# Patient Record
Sex: Male | Born: 1985 | Race: Black or African American | Hispanic: No | Marital: Single | Smoking: Current some day smoker
Health system: Southern US, Community
[De-identification: ages and names within clinical notes are randomized; demographics above are authoritative.]

## PROBLEM LIST (undated history)

## (undated) DIAGNOSIS — Z9889 Other specified postprocedural states: Secondary | ICD-10-CM

## (undated) DIAGNOSIS — Z8719 Personal history of other diseases of the digestive system: Secondary | ICD-10-CM

---

## 2008-07-11 ENCOUNTER — Emergency Department (HOSPITAL_COMMUNITY): Admission: EM | Admit: 2008-07-11 | Discharge: 2008-07-11 | Payer: Self-pay | Admitting: Emergency Medicine

## 2018-06-24 ENCOUNTER — Emergency Department (HOSPITAL_COMMUNITY)
Admission: EM | Admit: 2018-06-24 | Discharge: 2018-06-24 | Disposition: A | Discharge status: Home / Self Care | Payer: Self-pay | Attending: Emergency Medicine | Admitting: Emergency Medicine

## 2018-06-24 ENCOUNTER — Other Ambulatory Visit: Payer: Self-pay

## 2018-06-24 ENCOUNTER — Encounter (HOSPITAL_COMMUNITY): Payer: Self-pay | Admitting: Emergency Medicine

## 2018-06-24 DIAGNOSIS — F17200 Nicotine dependence, unspecified, uncomplicated: Secondary | ICD-10-CM | POA: Insufficient documentation

## 2018-06-24 DIAGNOSIS — Z029 Encounter for administrative examinations, unspecified: Secondary | ICD-10-CM | POA: Insufficient documentation

## 2018-06-24 DIAGNOSIS — Z7189 Other specified counseling: Secondary | ICD-10-CM

## 2018-06-24 HISTORY — DX: Other specified postprocedural states: Z98.890

## 2018-06-24 HISTORY — DX: Personal history of other diseases of the digestive system: Z87.19

## 2018-06-24 NOTE — ED Triage Notes (Signed)
Patient has been in contact with a family member that tested positive for Covid-19. He reports no symptoms.

## 2018-06-24 NOTE — ED Provider Notes (Signed)
[facility]-EMERGENCY DEPT Provider Note   CSN: [id number] Arrival date & time: 06/24/18  1132    History   Chief Complaint No chief complaint on file.   HPI Leanna Satorey Billingham is a 33 y.o. male.     The history is provided by the patient. No language interpreter was used.     33 year old male presenting to ED with concerns of COVID-19 exposure.  Patient report approximately a month ago he came in contact with his aunt.  3 days ago his aunt found out she tested positive for CoVID-19.  Patient did not recall his aunt having any symptoms when he last interact with her. Pt also report no covid-19 symptoms currently.  No fever, body aches, flu like symptoms, loss of taste or smell.  He mentioned about the exposure at work and his work place request pt to come to the ER to be evaluated before returning back to work.  Pt has no other complaint.   Past Medical History:  Diagnosis Date  . H/O hernia repair     There are no active problems to display for this patient.   History reviewed. No pertinent surgical history.      Home Medications    Prior to Admission medications   Not on File    Family History History reviewed. No pertinent family history.  Social History Social History   Tobacco Use  . Smoking status: Current Some Day Smoker  . Smokeless tobacco: Never Used  Substance Use Topics  . Alcohol use: Not on file  . Drug use: Not on file     Allergies   Patient has no known allergies.   Review of Systems Review of Systems  All other systems reviewed and are negative.    Physical Exam Updated Vital Signs BP 124/75 (BP Location: Left Arm)   Pulse (!) 51   Temp 98.4 F (36.9 C) (Oral)   Resp (!) 24   Ht 5\' 7"  (1.702 m)   Wt 86.2 kg   SpO2 100%   BMI 29.76 kg/m   Physical Exam Vitals signs and nursing note reviewed.  Constitutional:      General: He is not in acute distress.    Appearance: He is well-developed.  HENT:     Head:  Atraumatic.  Eyes:     Conjunctiva/sclera: Conjunctivae normal.  Neck:     Musculoskeletal: Neck supple.  Cardiovascular:     Rate and Rhythm: Normal rate and regular rhythm.     Pulses: Normal pulses.     Heart sounds: Normal heart sounds.  Pulmonary:     Effort: Pulmonary effort is normal.     Breath sounds: Normal breath sounds. No wheezing or rales.  Abdominal:     Palpations: Abdomen is soft.     Tenderness: There is no abdominal tenderness.  Skin:    Findings: No rash.  Neurological:     Mental Status: He is alert and oriented to person, place, and time.      ED Treatments / Results  Labs (all labs ordered are listed, but only abnormal results are displayed) Labs Reviewed - No data to display  EKG None  Radiology No results found.  Procedures Procedures (including critical care time)  Medications Ordered in ED Medications - No data to display   Initial Impression / Assessment and Plan / ED Course  I have reviewed the triage vital signs and the nursing notes.  Pertinent labs & imaging results that were available during  my care of the patient were reviewed by me and considered in my medical decision making (see chart for details).        BP 124/75 (BP Location: Left Arm)   Pulse (!) 51   Temp 98.4 F (36.9 C) (Oral)   Resp (!) 24   Ht 5\' 7"  (1.702 m)   Wt 86.2 kg   SpO2 100%   BMI 29.76 kg/m    Final Clinical Impressions(s) / ED Diagnoses   Final diagnoses:  Advice Given About Covid-19 Virus Infection    ED Discharge Orders    None     Pt voice possible exposure to covid-19.  However, exposure has been a month ago and pt does not exhibit any sxs to suggest covid-19 infection.  Will provide work note to return to work as he is low risk.   Reshad Perkowski was evaluated in Emergency Department on 06/24/2018 for the symptoms described in the history of present illness. He was evaluated in the context of the global COVID-19 pandemic, which  necessitated consideration that the patient might be at risk for infection with the SARS-CoV-2 virus that causes COVID-19. Institutional protocols and algorithms that pertain to the evaluation of patients at risk for COVID-19 are in a state of rapid change based on information released by regulatory bodies including the CDC and federal and state organizations. These policies and algorithms were followed during the patient's care in the ED.    Fayrene Helper, PA-C 06/24/18 1215    Terrilee Files, MD 06/25/18 [phone]

## 2018-06-24 NOTE — ED Notes (Signed)
Bed: WTR6 Expected date:  Expected time:  Means of arrival:  Comments: 

## 2018-06-24 NOTE — Discharge Instructions (Signed)
It is unlikely that you have been exposed to COVID-19 from your prior interaction with your aunt 1 month ago.  You may return to work.

## 2018-10-27 ENCOUNTER — Other Ambulatory Visit: Payer: Self-pay | Admitting: Internal Medicine

## 2018-10-27 DIAGNOSIS — Z20822 Contact with and (suspected) exposure to covid-19: Secondary | ICD-10-CM

## 2018-10-28 LAB — NOVEL CORONAVIRUS, NAA: SARS-CoV-2, NAA: NOT DETECTED

## 2021-04-17 ENCOUNTER — Emergency Department (HOSPITAL_COMMUNITY): Payer: Self-pay

## 2021-04-17 ENCOUNTER — Emergency Department (HOSPITAL_COMMUNITY)
Admission: EM | Admit: 2021-04-17 | Discharge: 2021-04-17 | Disposition: A | Discharge status: Home / Self Care | Payer: Self-pay | Attending: Emergency Medicine | Admitting: Emergency Medicine

## 2021-04-17 ENCOUNTER — Other Ambulatory Visit: Payer: Self-pay

## 2021-04-17 ENCOUNTER — Encounter (HOSPITAL_COMMUNITY): Payer: Self-pay

## 2021-04-17 DIAGNOSIS — X501XXA Overexertion from prolonged static or awkward postures, initial encounter: Secondary | ICD-10-CM | POA: Insufficient documentation

## 2021-04-17 DIAGNOSIS — Y990 Civilian activity done for income or pay: Secondary | ICD-10-CM | POA: Insufficient documentation

## 2021-04-17 DIAGNOSIS — S8001XA Contusion of right knee, initial encounter: Secondary | ICD-10-CM | POA: Insufficient documentation

## 2021-04-17 DIAGNOSIS — M25461 Effusion, right knee: Secondary | ICD-10-CM

## 2021-04-17 MED ORDER — ACETAMINOPHEN 500 MG PO TABS
1000.0000 mg | ORAL_TABLET | Freq: Once | ORAL | Status: AC
  Administered 2021-04-17: 1000 mg via ORAL
  Filled 2021-04-17: qty 2

## 2021-04-17 MED ORDER — IBUPROFEN 200 MG PO TABS
400.0000 mg | ORAL_TABLET | Freq: Once | ORAL | Status: AC
Start: 2021-04-17 — End: 2021-04-17
  Administered 2021-04-17: 400 mg via ORAL
  Filled 2021-04-17: qty 2

## 2021-04-17 NOTE — Discharge Instructions (Addendum)
It was our pleasure to provide your ER care today - we hope that you feel better.  On your xrays, no fracture is seen, there is some swelling.  Although no fracture is seen, there is the possibility of ligament or other soft tissue injury to the knee.  Follow up with orthopedic doctor in 1-2 weeks if knee soreness/pain fails to improve/resolve - call office to arrange appointment.   Take acetaminophen or ibuprofen as need.   Follow up with primary care doctor in 1-2 weeks if symptoms fail to improve/resolve.  Return to ER if worse, new symptoms, increased swelling, severe pain, fevers, or other concern.

## 2021-04-17 NOTE — ED Provider Notes (Signed)
[facility]-EMERGENCY DEPT Provider Note   CSN: [id number] Arrival date & time: 04/17/21  5170     History  Chief Complaint  Patient presents with   Knee Pain    Jesse Stone is a 36 y.o. male.  Patient c/o right knee contusion/pain. Occurred 4 days ago at work, indicates something hit anterior/medial side of right knee. Dull pain to area since, mild-mod, non radiating. Skin intact. No redness or increased swelling to knee. Has been able to walk. Denies leg swelling. No ankle or hip pain. No numbness/weakness. No fever or chills.   The history is provided by the patient and medical records.  Knee Pain Associated symptoms: no fever       Home Medications Prior to Admission medications   Not on File      Allergies    Patient has no known allergies.    Review of Systems   Review of Systems  Constitutional:  Negative for chills and fever.  Respiratory:  Negative for shortness of breath.   Cardiovascular:  Negative for chest pain.  Musculoskeletal:        Pain/contusion right knee.   Skin:  Negative for wound.  Neurological:  Negative for numbness.   Physical Exam Updated Vital Signs BP 128/62 (BP Location: Left Arm)    Pulse 62    Temp 99.1 F (37.3 C) (Oral)    Resp 16    Ht 1.676 m (5\' 6" )    Wt 81.6 kg    SpO2 100%    BMI 29.05 kg/m  Physical Exam Vitals and nursing note reviewed.  Constitutional:      Appearance: Normal appearance. He is well-developed.  HENT:     Head: Atraumatic.     Nose: Nose normal.     Mouth/Throat:     Mouth: Mucous membranes are moist.  Eyes:     General: No scleral icterus.    Conjunctiva/sclera: Conjunctivae normal.  Neck:     Trachea: No tracheal deviation.  Cardiovascular:     Rate and Rhythm: Normal rate.     Pulses: Normal pulses.  Pulmonary:     Effort: Pulmonary effort is normal. No accessory muscle usage or respiratory distress.  Genitourinary:    Comments: No cva tenderness. Musculoskeletal:         General: No swelling.     Cervical back: Neck supple.     Right lower leg: No edema.     Left lower leg: No edema.     Comments: Tenderness anterior and medial aspect right knee. Knee is grossly stable. No effusion. No erythema or increased warmth. No pain w passive rom knee. Distal pulses palp. No RLE swelling. Skin appears normal and intact.   Skin:    General: Skin is warm and dry.     Findings: No rash.  Neurological:     Mental Status: He is alert.     Comments: Alert, speech clear. Steady gait. RLE motor/sens grossly intact.   Psychiatric:        Mood and Affect: Mood normal.    ED Results / Procedures / Treatments   Labs (all labs ordered are listed, but only abnormal results are displayed) Labs Reviewed - No data to display  EKG None  Radiology DG Knee Complete 4 Views Right  Result Date: 04/17/2021 CLINICAL DATA:  Trauma to left knee.  Pain. EXAM: RIGHT KNEE - COMPLETE 4+ VIEW COMPARISON:  None. FINDINGS: The knee is located. Small joint effusion is present. No  gas is present in the joint. No acute or healing fracture is present. Fragmentation at the tibial tuberosity is noted. IMPRESSION: 1. Small joint effusion. Ligamentous injury is not excluded. 2. Fragmentation at the tibial tuberosity. Electronically Signed   By: Marin Roberts M.D.   On: 04/17/2021 10:07    Procedures Procedures    Medications Ordered in ED Medications  acetaminophen (TYLENOL) tablet 1,000 mg (has no administration in time range)  ibuprofen (ADVIL) tablet 400 mg (has no administration in time range)    ED Course/ Medical Decision Making/ A&P                           Medical Decision Making Problems Addressed: Contusion of right knee, initial encounter: acute illness or injury  Amount and/or Complexity of Data Reviewed External Data Reviewed: radiology and notes. Radiology: ordered and independent interpretation performed. Decision-making details documented in ED  Course.  Risk OTC drugs.   Xrays.   No meds pta, and confirmed nkda.   Acetaminophen po, ibuprofen po.   Xrays reviewed/interpreted by me - no fx.  Small eff. Relayed to pt - although no acute fx, possibility of ligamentous and/or other soft tissue injury.   Pt appears stable for d/c.   Rec ortho/pcp f/u.            Final Clinical Impression(s) / ED Diagnoses Final diagnoses:  None    Rx / DC Orders ED Discharge Orders     None         Cathren Laine, MD 04/17/21 1058

## 2021-04-17 NOTE — ED Notes (Signed)
I provided reinforced discharge education based off of discharge instructions. Pt acknowledged and understood my education. Pt had no further questions/concerns for provider/myself.  °

## 2021-04-17 NOTE — ED Triage Notes (Signed)
"  Right knee pain since Friday, I think I may have twisted it" per pt

## 2022-08-18 IMAGING — CR DG KNEE COMPLETE 4+V*R*
4 series · 4 of 4 positions shown · non-contrast
Comparison: None.

CLINICAL DATA: Trauma to left knee.  Pain.

EXAM:
RIGHT KNEE - COMPLETE 4+ VIEW

[t knee ap right]
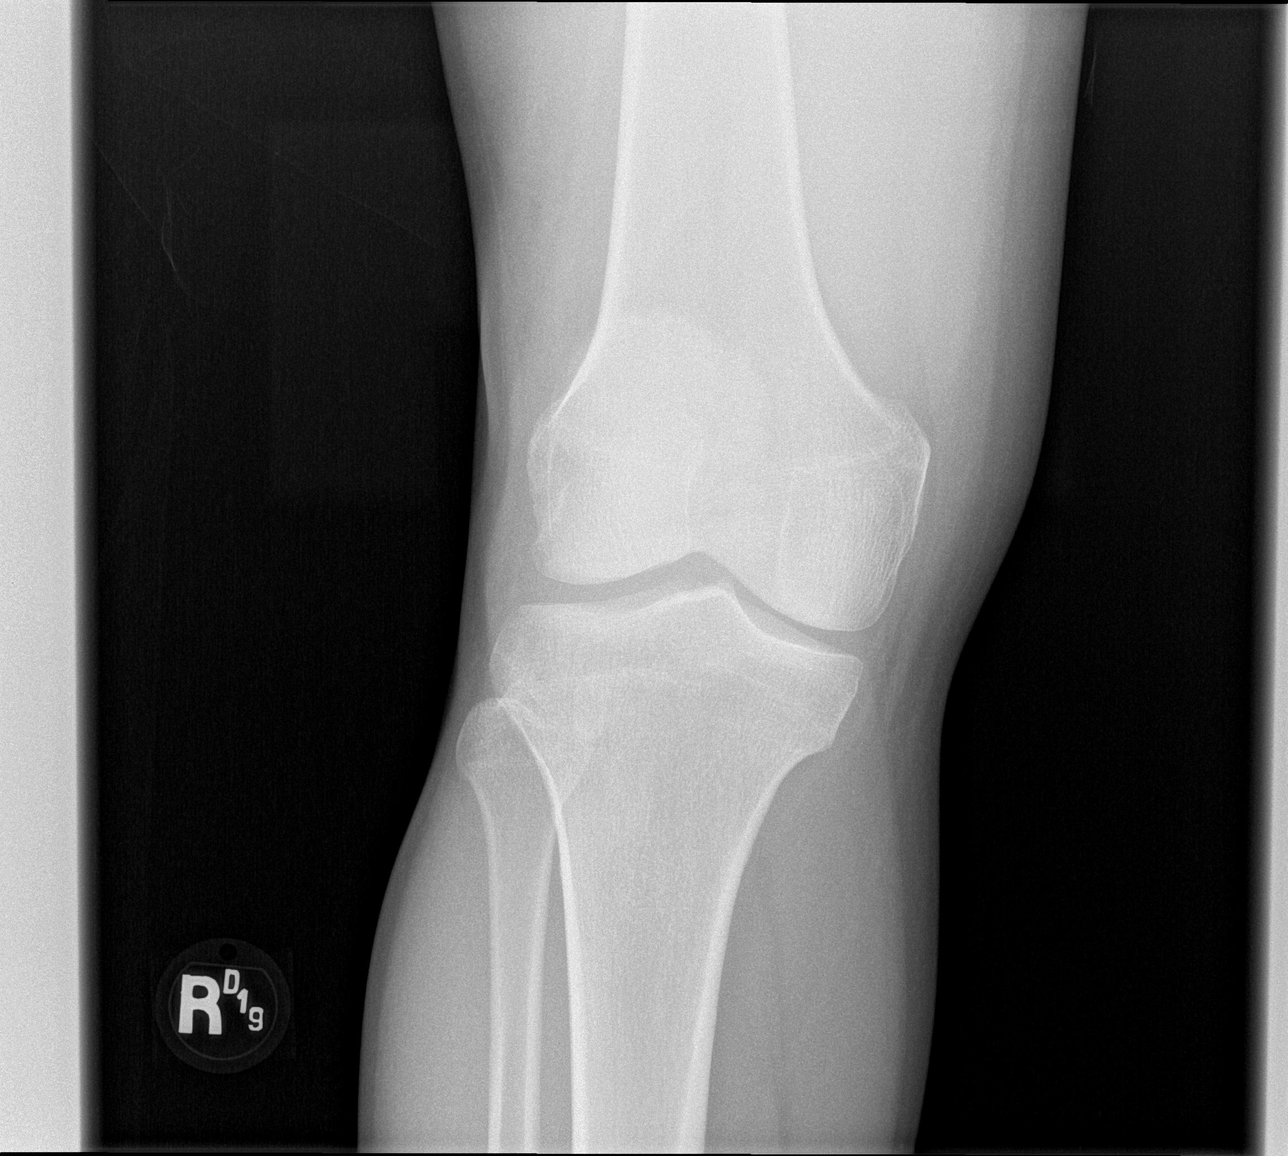

[t knee obl right (1 of 2)]
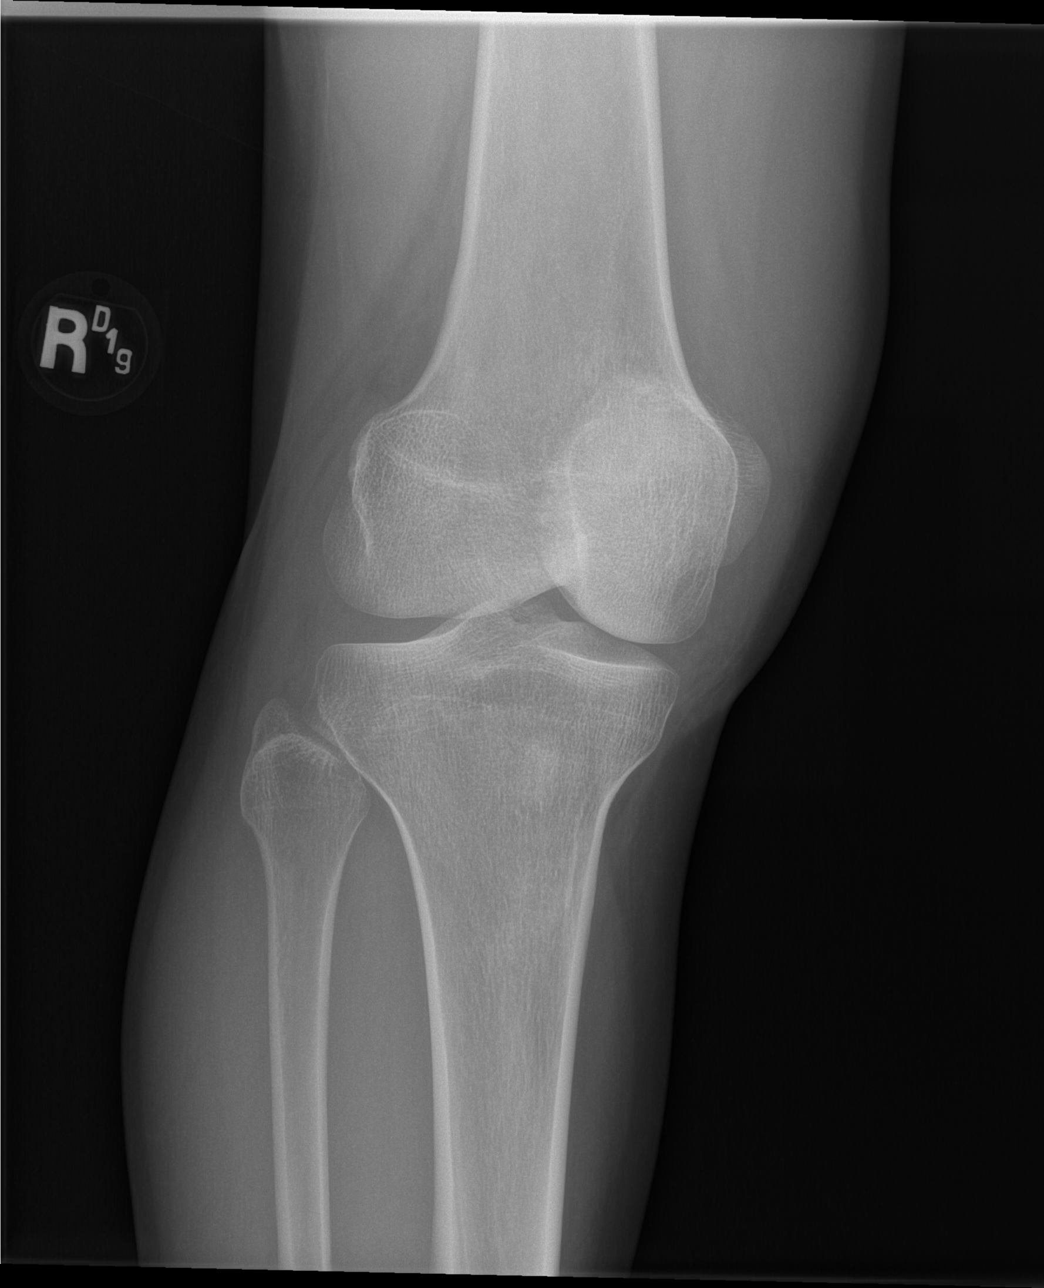

[t knee obl right (2 of 2)]
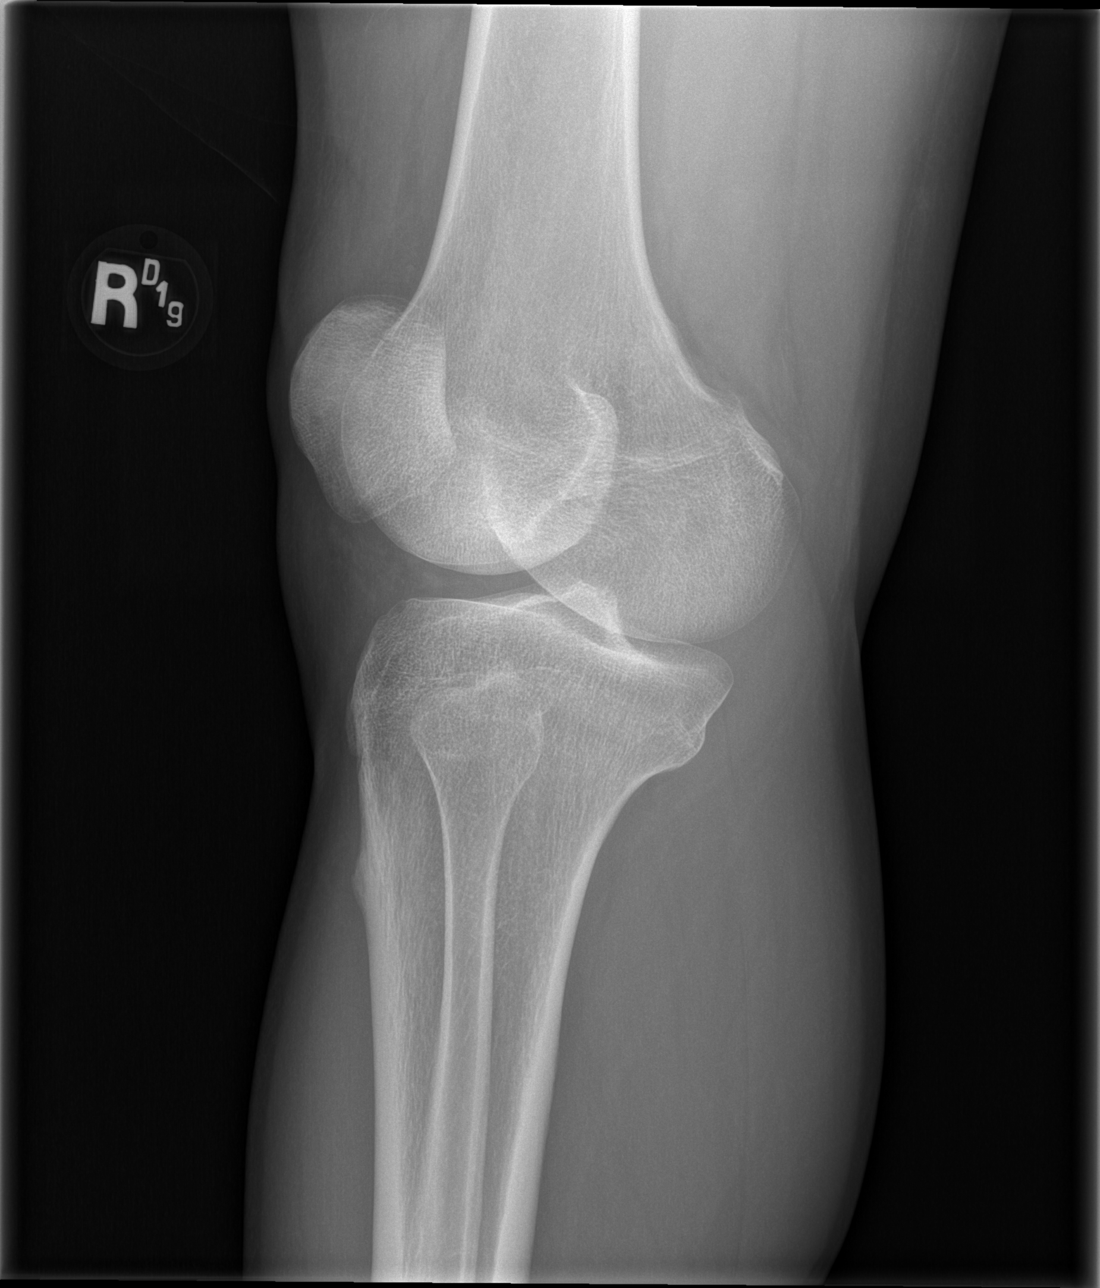

[t knee lat right]
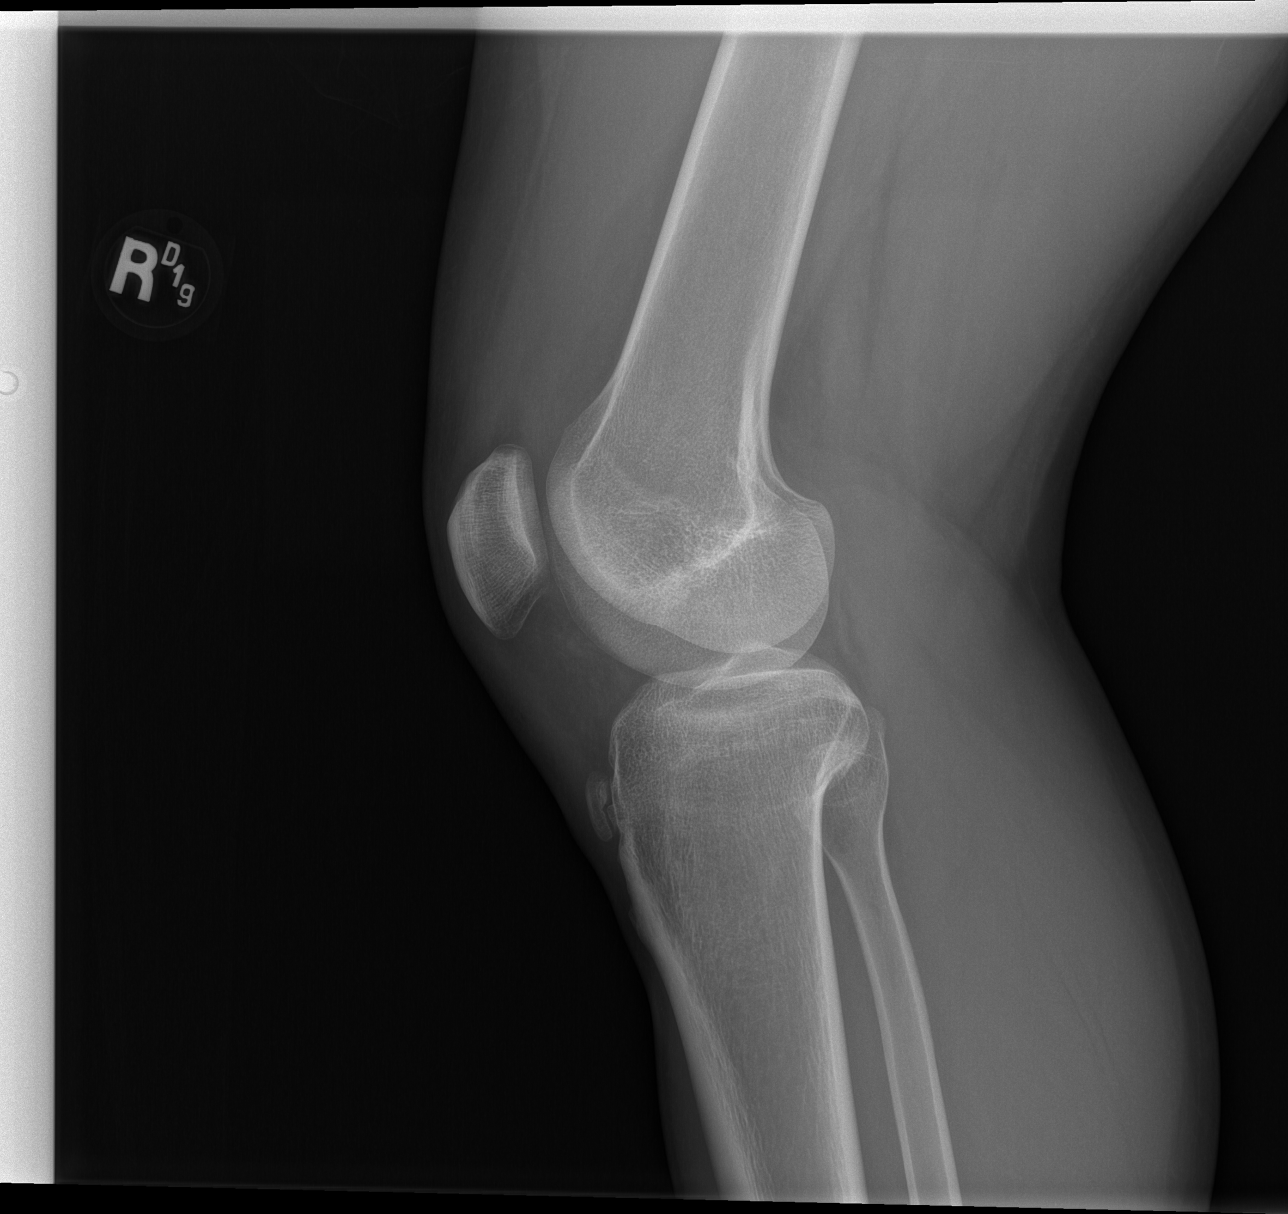

[4 of 4 positions shown; findings below may reference images not displayed]

FINDINGS: The knee is located. Small joint effusion is present. No gas is
present in the joint. No acute or healing fracture is present.
Fragmentation at the tibial tuberosity is noted.
IMPRESSION: 1. Small joint effusion. Ligamentous injury is not excluded.
2. Fragmentation at the tibial tuberosity.
